# Patient Record
Sex: Female | Born: 1960 | Race: White | Hispanic: No | Marital: Married | Smoking: Never smoker
Health system: Southern US, Community
[De-identification: ages and names within clinical notes are randomized; demographics above are authoritative.]

## PROBLEM LIST (undated history)

## (undated) ENCOUNTER — Emergency Department (HOSPITAL_COMMUNITY): Payer: 59

## (undated) DIAGNOSIS — K8020 Calculus of gallbladder without cholecystitis without obstruction: Secondary | ICD-10-CM

## (undated) DIAGNOSIS — F419 Anxiety disorder, unspecified: Secondary | ICD-10-CM

## (undated) HISTORY — PX: TONSILLECTOMY: SUR1361

## (undated) HISTORY — PX: WISDOM TOOTH EXTRACTION: SHX21

---

## 1994-08-15 HISTORY — PX: KNEE ARTHROSCOPY WITH ANTERIOR CRUCIATE LIGAMENT (ACL) REPAIR: SHX5644

## 1999-10-18 ENCOUNTER — Other Ambulatory Visit: Admission: RE | Admit: 1999-10-18 | Discharge: 1999-10-18 | Payer: Self-pay | Admitting: Obstetrics and Gynecology

## 2001-01-01 ENCOUNTER — Other Ambulatory Visit: Admission: RE | Admit: 2001-01-01 | Discharge: 2001-01-01 | Payer: Self-pay | Admitting: Obstetrics and Gynecology

## 2003-07-02 ENCOUNTER — Other Ambulatory Visit: Admission: RE | Admit: 2003-07-02 | Discharge: 2003-07-02 | Payer: Self-pay | Admitting: Obstetrics and Gynecology

## 2005-04-12 ENCOUNTER — Other Ambulatory Visit: Admission: RE | Admit: 2005-04-12 | Discharge: 2005-04-12 | Payer: Self-pay | Admitting: Obstetrics and Gynecology

## 2012-06-05 ENCOUNTER — Ambulatory Visit (HOSPITAL_COMMUNITY)
Admission: RE | Admit: 2012-06-05 | Discharge: 2012-06-05 | Disposition: A | Discharge status: Home / Self Care | Payer: 59 | Source: Ambulatory Visit | Attending: Obstetrics and Gynecology | Admitting: Obstetrics and Gynecology

## 2012-06-05 ENCOUNTER — Other Ambulatory Visit (HOSPITAL_COMMUNITY): Payer: Self-pay | Admitting: Obstetrics and Gynecology

## 2012-06-05 DIAGNOSIS — T1490XA Injury, unspecified, initial encounter: Secondary | ICD-10-CM

## 2012-06-05 DIAGNOSIS — S0990XA Unspecified injury of head, initial encounter: Secondary | ICD-10-CM | POA: Insufficient documentation

## 2012-06-05 DIAGNOSIS — X58XXXA Exposure to other specified factors, initial encounter: Secondary | ICD-10-CM | POA: Insufficient documentation

## 2015-09-14 ENCOUNTER — Other Ambulatory Visit: Payer: Self-pay | Admitting: Obstetrics and Gynecology

## 2015-09-14 DIAGNOSIS — R1011 Right upper quadrant pain: Secondary | ICD-10-CM

## 2015-09-17 ENCOUNTER — Inpatient Hospital Stay: Admission: RE | Admit: 2015-09-17 | Payer: Self-pay | Source: Ambulatory Visit

## 2015-09-29 ENCOUNTER — Ambulatory Visit
Admission: RE | Admit: 2015-09-29 | Discharge: 2015-09-29 | Disposition: A | Discharge status: Home / Self Care | Payer: 59 | Source: Ambulatory Visit | Attending: Obstetrics and Gynecology | Admitting: Obstetrics and Gynecology

## 2015-09-29 DIAGNOSIS — R1011 Right upper quadrant pain: Secondary | ICD-10-CM

## 2015-10-21 ENCOUNTER — Other Ambulatory Visit: Payer: Self-pay | Admitting: General Surgery

## 2015-10-23 ENCOUNTER — Encounter (HOSPITAL_COMMUNITY): Payer: Self-pay

## 2015-10-23 ENCOUNTER — Encounter (HOSPITAL_COMMUNITY)
Admission: RE | Admit: 2015-10-23 | Discharge: 2015-10-23 | Disposition: A | Discharge status: Home / Self Care | Payer: 59 | Source: Ambulatory Visit | Attending: General Surgery | Admitting: General Surgery

## 2015-10-23 DIAGNOSIS — Z01812 Encounter for preprocedural laboratory examination: Secondary | ICD-10-CM | POA: Insufficient documentation

## 2015-10-23 DIAGNOSIS — K8020 Calculus of gallbladder without cholecystitis without obstruction: Secondary | ICD-10-CM | POA: Insufficient documentation

## 2015-10-23 HISTORY — DX: Anxiety disorder, unspecified: F41.9

## 2015-10-23 LAB — COMPREHENSIVE METABOLIC PANEL
ALBUMIN: 4.6 g/dL (ref 3.5–5.0)
ALK PHOS: 63 U/L (ref 38–126)
ALT: 21 U/L (ref 14–54)
ANION GAP: 11 (ref 5–15)
AST: 20 U/L (ref 15–41)
BUN: 16 mg/dL (ref 6–20)
CHLORIDE: 105 mmol/L (ref 101–111)
CO2: 24 mmol/L (ref 22–32)
CREATININE: 0.74 mg/dL (ref 0.44–1.00)
Calcium: 9.8 mg/dL (ref 8.9–10.3)
GFR calc non Af Amer: >60 mL/min (ref >60)
GLUCOSE: 88 mg/dL (ref 65–99)
POTASSIUM: 3.9 mmol/L (ref 3.5–5.1)
SODIUM: 140 mmol/L (ref 135–145)
Total Bilirubin: 0.8 mg/dL (ref 0.3–1.2)
Total Protein: 7.5 g/dL (ref 6.5–8.1)

## 2015-10-23 LAB — CBC WITH DIFFERENTIAL/PLATELET
BASOS PCT: 1 %
Basophils Absolute: 0 10*3/uL (ref 0.0–0.1)
EOS ABS: 0.1 10*3/uL (ref 0.0–0.7)
EOS PCT: 2 %
HCT: 41.9 % (ref 36.0–46.0)
HEMOGLOBIN: 14.4 g/dL (ref 12.0–15.0)
LYMPHS ABS: 1.3 10*3/uL (ref 0.7–4.0)
Lymphocytes Relative: 31 %
MCH: 31.9 pg (ref 26.0–34.0)
MCHC: 34.4 g/dL (ref 30.0–36.0)
MCV: 92.9 fL (ref 78.0–100.0)
MONOS PCT: 7 %
Monocytes Absolute: 0.3 10*3/uL (ref 0.1–1.0)
NEUTROS PCT: 59 %
Neutro Abs: 2.5 10*3/uL (ref 1.7–7.7)
PLATELETS: 174 10*3/uL (ref 150–400)
RBC: 4.51 MIL/uL (ref 3.87–5.11)
RDW: 12 % (ref 11.5–15.5)
WBC: 4.2 10*3/uL (ref 4.0–10.5)

## 2015-10-23 LAB — HCG, SERUM, QUALITATIVE: Preg, Serum: NEGATIVE

## 2015-10-23 NOTE — Progress Notes (Signed)
Pt. Is without PCP, followed by Dr. Arelia SneddonMcComb.  Pt. Reports that she had stress test long time ago, /w Dr. Deborah Chalkennant, all was found to be wnl, No need for any cardiac f/u. Pt. Denies all chest concerns today.

## 2015-10-23 NOTE — Pre-Procedure Instructions (Signed)
April Randolph  10/23/2015      [id number]- [place], [place] - [address] [facility] [id number] [place] [id number] Phone: [phone] Fax: [phone]    Your procedure is scheduled on 10/28/2015.  [facility]  Call this number if you have problems the morning of surgery:  [phone]   Remember:  Do not eat food or drink liquids after midnight.   Take these medicines the morning of surgery with A SIP OF WATER : Xanax, Tylenol is OK before surgery                       NO more ibuprofen- STOP   Do not wear jewelry, make-up or nail polish.   Do not wear lotions, powders, or perfumes.  You may wear deodorant.   Do not shave 48 hours prior to surgery.    Do not bring valuables to the hospital.   [facility] is not responsible for any belongings or valuables.  Contacts, dentures or bridgework may not be worn into surgery.  Leave your suitcase in the car.  After surgery it may be brought to your room.  For patients admitted to the hospital, discharge time will be determined by your treatment team.  Patients discharged the day of surgery will not be allowed to drive home.   Name and phone number of your driver:   Daughter   Special instructions:  Special Instructions: [facility] - Preparing for Surgery  Before surgery, you can play an important role.  Because skin is not sterile, your skin needs to be as free of germs as possible.  You can reduce the number of germs on you skin by washing with CHG (chlorahexidine gluconate) soap before surgery.  CHG is an antiseptic cleaner which kills germs and bonds with the skin to continue killing germs even after washing.  Please DO NOT use if you have an allergy to CHG or antibacterial soaps.  If your skin becomes reddened/irritated stop using the CHG and inform your nurse when you arrive at Short Stay.  Do not shave (including  legs and underarms) for at least 48 hours prior to the first CHG shower.  You may shave your face.  Please follow these instructions carefully:   1.  Shower with CHG Soap the night before surgery and the  morning of Surgery.  2.  If you choose to wash your hair, wash your hair first as usual with your  normal shampoo.  3.  After you shampoo, rinse your hair and body thoroughly to remove the  Shampoo.  4.  Use CHG as you would any other liquid soap.  You can apply chg directly to the skin and wash gently with scrungie or a clean washcloth.  5.  Apply the CHG Soap to your body ONLY FROM THE NECK DOWN.    Do not use on open wounds or open sores.  Avoid contact with your eyes, ears, mouth and genitals (private parts).  Wash genitals (private parts)   with your normal soap.  6.  Wash thoroughly, paying special attention to the area where your surgery will be performed.  7.  Thoroughly rinse your body with warm water from the neck down.  8.  DO NOT shower/wash with your normal soap after using and rinsing off   the CHG Soap.  9.  Pat yourself dry with  a clean towel.            10.  Wear clean pajamas.            11.  Place clean sheets on your bed the night of your first shower and do not sleep with pets.  Day of Surgery  Do not apply any lotions/deodorants the morning of surgery.  Please wear clean clothes to the hospital/surgery center.  Please read over the following fact sheets that you were given. Pain Booklet, Coughing and Deep Breathing and Surgical Site Infection Prevention

## 2015-10-27 ENCOUNTER — Encounter (HOSPITAL_BASED_OUTPATIENT_CLINIC_OR_DEPARTMENT_OTHER): Payer: Self-pay | Admitting: *Deleted

## 2015-10-27 MED ORDER — CIPROFLOXACIN IN D5W 400 MG/200ML IV SOLN
400.0000 mg | INTRAVENOUS | Status: AC
  Administered 2015-10-28: 400 mg via INTRAVENOUS

## 2015-10-28 ENCOUNTER — Encounter (HOSPITAL_BASED_OUTPATIENT_CLINIC_OR_DEPARTMENT_OTHER)
Admission: RE | Disposition: A | Discharge status: Home / Self Care | Payer: Self-pay | Source: Ambulatory Visit | Attending: General Surgery

## 2015-10-28 ENCOUNTER — Ambulatory Visit (HOSPITAL_COMMUNITY)
Admission: RE | Admit: 2015-10-28 | Discharge: 2015-10-28 | Disposition: A | Discharge status: Home / Self Care | Payer: 59 | Source: Ambulatory Visit | Attending: General Surgery | Admitting: General Surgery

## 2015-10-28 ENCOUNTER — Ambulatory Visit (HOSPITAL_BASED_OUTPATIENT_CLINIC_OR_DEPARTMENT_OTHER): Payer: 59 | Admitting: Certified Registered"

## 2015-10-28 ENCOUNTER — Encounter (HOSPITAL_BASED_OUTPATIENT_CLINIC_OR_DEPARTMENT_OTHER): Payer: Self-pay | Admitting: Certified Registered"

## 2015-10-28 DIAGNOSIS — F419 Anxiety disorder, unspecified: Secondary | ICD-10-CM | POA: Insufficient documentation

## 2015-10-28 DIAGNOSIS — K8010 Calculus of gallbladder with chronic cholecystitis without obstruction: Secondary | ICD-10-CM | POA: Diagnosis not present

## 2015-10-28 DIAGNOSIS — K8020 Calculus of gallbladder without cholecystitis without obstruction: Secondary | ICD-10-CM | POA: Diagnosis present

## 2015-10-28 HISTORY — PX: CHOLECYSTECTOMY: SHX55

## 2015-10-28 HISTORY — DX: Calculus of gallbladder without cholecystitis without obstruction: K80.20

## 2015-10-28 SURGERY — LAPAROSCOPIC CHOLECYSTECTOMY
Anesthesia: General | Site: Abdomen | Laterality: Bilateral

## 2015-10-28 MED ORDER — ONDANSETRON HCL 4 MG/2ML IJ SOLN
INTRAMUSCULAR | Status: DC | PRN
  Administered 2015-10-28: 4 mg via INTRAVENOUS

## 2015-10-28 MED ORDER — BUPIVACAINE-EPINEPHRINE 0.25% -1:200000 IJ SOLN
INTRAMUSCULAR | Status: DC | PRN
  Administered 2015-10-28: 11 mL

## 2015-10-28 MED ORDER — HYDROMORPHONE HCL 1 MG/ML IJ SOLN
0.2500 mg | INTRAMUSCULAR | Status: DC | PRN
  Administered 2015-10-28 (×5): 0.5 mg via INTRAVENOUS

## 2015-10-28 MED ORDER — HYDROMORPHONE HCL 1 MG/ML IJ SOLN
INTRAMUSCULAR | Status: AC
  Filled 2015-10-28: qty 1

## 2015-10-28 MED ORDER — LACTATED RINGERS IV SOLN
INTRAVENOUS | Status: DC
  Administered 2015-10-28 (×2): via INTRAVENOUS

## 2015-10-28 MED ORDER — FENTANYL CITRATE (PF) 100 MCG/2ML IJ SOLN
50.0000 ug | INTRAMUSCULAR | Status: DC | PRN
  Administered 2015-10-28 (×2): 50 ug via INTRAVENOUS

## 2015-10-28 MED ORDER — CIPROFLOXACIN IN D5W 400 MG/200ML IV SOLN
INTRAVENOUS | Status: AC
  Filled 2015-10-28: qty 200

## 2015-10-28 MED ORDER — MIDAZOLAM HCL 2 MG/2ML IJ SOLN
1.0000 mg | INTRAMUSCULAR | Status: DC | PRN
  Administered 2015-10-28: 2 mg via INTRAVENOUS

## 2015-10-28 MED ORDER — LIDOCAINE HCL (CARDIAC) 20 MG/ML IV SOLN
INTRAVENOUS | Status: AC
  Filled 2015-10-28: qty 5

## 2015-10-28 MED ORDER — ROCURONIUM BROMIDE 50 MG/5ML IV SOLN
INTRAVENOUS | Status: AC
  Filled 2015-10-28: qty 1

## 2015-10-28 MED ORDER — OXYCODONE HCL 5 MG PO TABS: 5.0000 mg | ORAL_TABLET | Freq: Four times a day (QID) | ORAL | Status: AC | PRN

## 2015-10-28 MED ORDER — DEXAMETHASONE SODIUM PHOSPHATE 10 MG/ML IJ SOLN
INTRAMUSCULAR | Status: AC
  Filled 2015-10-28: qty 1

## 2015-10-28 MED ORDER — PROPOFOL 10 MG/ML IV BOLUS
INTRAVENOUS | Status: DC | PRN
  Administered 2015-10-28: 200 mg via INTRAVENOUS

## 2015-10-28 MED ORDER — HYDROMORPHONE HCL 1 MG/ML IJ SOLN: 1.0000 mg | INTRAMUSCULAR | Status: DC | PRN

## 2015-10-28 MED ORDER — DEXAMETHASONE SODIUM PHOSPHATE 4 MG/ML IJ SOLN
INTRAMUSCULAR | Status: DC | PRN
  Administered 2015-10-28: 10 mg via INTRAVENOUS

## 2015-10-28 MED ORDER — PROMETHAZINE HCL 25 MG/ML IJ SOLN: 6.2500 mg | INTRAMUSCULAR | Status: DC | PRN

## 2015-10-28 MED ORDER — PROPOFOL 500 MG/50ML IV EMUL
INTRAVENOUS | Status: AC
  Filled 2015-10-28: qty 50

## 2015-10-28 MED ORDER — KETOROLAC TROMETHAMINE 30 MG/ML IJ SOLN
30.0000 mg | Freq: Four times a day (QID) | INTRAMUSCULAR | Status: DC | PRN
  Administered 2015-10-28: 30 mg via INTRAVENOUS

## 2015-10-28 MED ORDER — KETOROLAC TROMETHAMINE 30 MG/ML IJ SOLN
INTRAMUSCULAR | Status: AC
  Filled 2015-10-28: qty 1

## 2015-10-28 MED ORDER — HYDROCODONE-ACETAMINOPHEN 10-325 MG PO TABS: 1.0000 | ORAL_TABLET | Freq: Four times a day (QID) | ORAL | Status: AC | PRN

## 2015-10-28 MED ORDER — ROCURONIUM BROMIDE 100 MG/10ML IV SOLN
INTRAVENOUS | Status: DC | PRN
  Administered 2015-10-28: 40 mg via INTRAVENOUS

## 2015-10-28 MED ORDER — SUGAMMADEX SODIUM 200 MG/2ML IV SOLN
INTRAVENOUS | Status: DC | PRN
  Administered 2015-10-28: 169.4 mg via INTRAVENOUS

## 2015-10-28 MED ORDER — LIDOCAINE HCL (CARDIAC) 20 MG/ML IV SOLN
INTRAVENOUS | Status: DC | PRN
  Administered 2015-10-28: 60 mg via INTRAVENOUS

## 2015-10-28 MED ORDER — GLYCOPYRROLATE 0.2 MG/ML IJ SOLN: 0.2000 mg | Freq: Once | INTRAMUSCULAR | Status: DC | PRN

## 2015-10-28 MED ORDER — FENTANYL CITRATE (PF) 100 MCG/2ML IJ SOLN
INTRAMUSCULAR | Status: AC
  Filled 2015-10-28: qty 2

## 2015-10-28 MED ORDER — OXYCODONE HCL 5 MG PO TABS: 5.0000 mg | ORAL_TABLET | ORAL | Status: DC | PRN

## 2015-10-28 MED ORDER — MIDAZOLAM HCL 2 MG/2ML IJ SOLN
INTRAMUSCULAR | Status: AC
  Filled 2015-10-28: qty 2

## 2015-10-28 MED ORDER — ONDANSETRON HCL 4 MG/2ML IJ SOLN
INTRAMUSCULAR | Status: AC
  Filled 2015-10-28: qty 2

## 2015-10-28 MED ORDER — SCOPOLAMINE 1 MG/3DAYS TD PT72: 1.0000 | MEDICATED_PATCH | Freq: Once | TRANSDERMAL | Status: DC | PRN

## 2015-10-28 MED ORDER — OXYCODONE HCL 5 MG PO TABS
5.0000 mg | ORAL_TABLET | ORAL | Status: DC | PRN
  Administered 2015-10-28: 5 mg via ORAL

## 2015-10-28 MED ORDER — PROMETHAZINE HCL 12.5 MG PO TABS: 12.5000 mg | ORAL_TABLET | Freq: Four times a day (QID) | ORAL | Status: AC | PRN

## 2015-10-28 MED ORDER — SODIUM CHLORIDE 0.9 % IR SOLN
Status: DC | PRN
Start: 2015-10-28 — End: 2015-10-28
  Administered 2015-10-28: 200 mL

## 2015-10-28 MED ORDER — PHENYLEPHRINE HCL 10 MG/ML IJ SOLN
INTRAMUSCULAR | Status: DC | PRN
  Administered 2015-10-28: 80 ug via INTRAVENOUS

## 2015-10-28 MED ORDER — OXYCODONE HCL 5 MG PO TABS
ORAL_TABLET | ORAL | Status: AC
  Filled 2015-10-28: qty 1

## 2015-10-28 SURGICAL SUPPLY — 47 items
APPLIER CLIP 5 13 M/L LIGAMAX5 (MISCELLANEOUS) ×2
APR CLP MED LRG 5 ANG JAW (MISCELLANEOUS) ×1
BAG SPEC RTRVL 10 TROC 200 (ENDOMECHANICALS) ×1
BLADE CLIPPER SURG (BLADE) IMPLANT
CANISTER SUCT 1200ML W/VALVE (MISCELLANEOUS) ×2 IMPLANT
CHLORAPREP W/TINT 26ML (MISCELLANEOUS) ×2 IMPLANT
CLIP APPLIE 5 13 M/L LIGAMAX5 (MISCELLANEOUS) ×1 IMPLANT
COVER MAYO STAND STRL (DRAPES) IMPLANT
DECANTER SPIKE VIAL GLASS SM (MISCELLANEOUS) IMPLANT
DEVICE TROCAR PUNCTURE CLOSURE (ENDOMECHANICALS) IMPLANT
DRAPE C-ARM 42X72 X-RAY (DRAPES) IMPLANT
DRAPE LAPAROSCOPIC ABDOMINAL (DRAPES) ×2 IMPLANT
ELECT REM PT RETURN 9FT ADLT (ELECTROSURGICAL) ×2
ELECTRODE REM PT RTRN 9FT ADLT (ELECTROSURGICAL) ×1 IMPLANT
FILTER SMOKE EVAC LAPAROSHD (FILTER) IMPLANT
GLOVE BIO SURGEON STRL SZ 6.5 (GLOVE) ×2 IMPLANT
GLOVE BIO SURGEON STRL SZ7 (GLOVE) ×2 IMPLANT
GLOVE BIOGEL M 7.0 STRL (GLOVE) ×2 IMPLANT
GLOVE BIOGEL PI IND STRL 6.5 (GLOVE) ×1 IMPLANT
GLOVE BIOGEL PI IND STRL 7.5 (GLOVE) ×2 IMPLANT
GLOVE BIOGEL PI INDICATOR 6.5 (GLOVE) ×1
GLOVE BIOGEL PI INDICATOR 7.5 (GLOVE) ×2
GLOVE ECLIPSE 6.5 STRL STRAW (GLOVE) ×2 IMPLANT
GLOVE SURG SS PI 7.0 STRL IVOR (GLOVE) ×2 IMPLANT
GOWN STRL REUS W/ TWL LRG LVL3 (GOWN DISPOSABLE) ×4 IMPLANT
GOWN STRL REUS W/TWL LRG LVL3 (GOWN DISPOSABLE) ×8
HEMOSTAT SNOW SURGICEL 2X4 (HEMOSTASIS) IMPLANT
LIQUID BAND (GAUZE/BANDAGES/DRESSINGS) ×2 IMPLANT
NS IRRIG 1000ML POUR BTL (IV SOLUTION) ×2 IMPLANT
PACK BASIN DAY SURGERY FS (CUSTOM PROCEDURE TRAY) ×2 IMPLANT
POUCH RETRIEVAL ECOSAC 10 (ENDOMECHANICALS) ×1 IMPLANT
POUCH RETRIEVAL ECOSAC 10MM (ENDOMECHANICALS) ×1
SCISSORS LAP 5X35 DISP (ENDOMECHANICALS) ×2 IMPLANT
SET CHOLANGIOGRAPH 5 50 .035 (SET/KITS/TRAYS/PACK) IMPLANT
SET IRRIG TUBING LAPAROSCOPIC (IRRIGATION / IRRIGATOR) ×2 IMPLANT
SLEEVE ENDOPATH XCEL 5M (ENDOMECHANICALS) ×4 IMPLANT
SLEEVE SCD COMPRESS KNEE MED (MISCELLANEOUS) ×2 IMPLANT
SPECIMEN JAR SMALL (MISCELLANEOUS) ×2 IMPLANT
STRIP CLOSURE SKIN 1/2X4 (GAUZE/BANDAGES/DRESSINGS) ×2 IMPLANT
SUT MNCRL AB 4-0 PS2 18 (SUTURE) ×2 IMPLANT
SUT VICRYL 0 UR6 27IN ABS (SUTURE) ×6 IMPLANT
TOWEL OR 17X24 6PK STRL BLUE (TOWEL DISPOSABLE) ×4 IMPLANT
TRAY LAPAROSCOPIC (CUSTOM PROCEDURE TRAY) ×2 IMPLANT
TROCAR XCEL BLUNT TIP 100MML (ENDOMECHANICALS) ×2 IMPLANT
TROCAR XCEL NON-BLD 5MMX100MML (ENDOMECHANICALS) ×2 IMPLANT
TUBE CONNECTING 20X1/4 (TUBING) ×2 IMPLANT
TUBING INSUFFLATION (TUBING) ×2 IMPLANT

## 2015-10-28 NOTE — Anesthesia Preprocedure Evaluation (Addendum)
Anesthesia Evaluation  Patient identified by MRN, date of birth, ID band Patient awake    Reviewed: Allergy & Precautions, Patient's Chart, lab work & pertinent test results  History of Anesthesia Complications (+) PONV and history of anesthetic complications  Airway Mallampati: I       Dental  (+) Teeth Intact   Pulmonary neg pulmonary ROS,    breath sounds clear to auscultation       Cardiovascular negative cardio ROS   Rhythm:Regular Rate:Normal     Neuro/Psych PSYCHIATRIC DISORDERS Anxiety negative neurological ROS     GI/Hepatic negative GI ROS, Neg liver ROS,   Endo/Other  negative endocrine ROS  Renal/GU negative Renal ROS     Musculoskeletal negative musculoskeletal ROS (+)   Abdominal   Peds  Hematology negative hematology ROS (+)   Anesthesia Other Findings   Reproductive/Obstetrics                            Anesthesia Physical Anesthesia Plan  ASA: I  Anesthesia Plan: General   Post-op Pain Management:    Induction: Intravenous  Airway Management Planned: Oral ETT  Additional Equipment:   Intra-op Plan:   Post-operative Plan: Extubation in OR  Informed Consent: I have reviewed the patients History and Physical, chart, labs and discussed the procedure including the risks, benefits and alternatives for the proposed anesthesia with the patient or authorized representative who has indicated his/her understanding and acceptance.   Dental advisory given  Plan Discussed with: CRNA and Surgeon  Anesthesia Plan Comments:         Anesthesia Quick Evaluation

## 2015-10-28 NOTE — Anesthesia Procedure Notes (Addendum)
Procedure Name: Intubation Date/Time: 10/28/2015 11:02 AM Performed by: Sheffield Hawker D Pre-anesthesia Checklist: Patient identified, Emergency Drugs available, Suction available and Patient being monitored Patient Re-evaluated:Patient Re-evaluated prior to inductionOxygen Delivery Method: Circle System Utilized Preoxygenation: Pre-oxygenation with 100% oxygen Intubation Type: IV induction Ventilation: Mask ventilation without difficulty Laryngoscope Size: Mac and 3 Tube type: Oral Tube size: 7.0 mm Number of attempts: 1 Airway Equipment and Method: Stylet and Oral airway Placement Confirmation: ETT inserted through vocal cords under direct vision,  positive ETCO2 and breath sounds checked- equal and bilateral Secured at: 21 cm Tube secured with: Tape Dental Injury: Teeth and Oropharynx as per pre-operative assessment    Performed by: Majd Tissue D

## 2015-10-28 NOTE — Discharge Instructions (Signed)
[facility] °LAPAROSCOPIC SURGERY: POST OP INSTRUCTIONS ° °Always review your discharge instruction sheet given to you by the facility where your surgery was performed. °IF YOU HAVE DISABILITY OR FAMILY LEAVE FORMS, YOU MUST BRING THEM TO THE OFFICE FOR PROCESSING.   °DO NOT GIVE THEM TO YOUR DOCTOR. ° °1. A prescription for pain medication may be given to you upon discharge.  Take your pain medication as prescribed, if needed.  If narcotic pain medicine is not needed, then you may take acetaminophen (Tylenol), naprosyn (Alleve), or ibuprofen (Advil) as needed. °2. Take your usually prescribed medications unless otherwise directed. °3. If you need a refill on your pain medication, please contact your pharmacy.  They will contact our office to request authorization. Prescriptions will not be filled after 5pm or on week-ends. °4. You should follow a light diet the first few days after arrival home, such as soup and crackers, etc.  Be sure to include lots of fluids daily. °5. Most patients will experience some swelling and bruising in the area of the incisions.  Ice packs will help.  Swelling and bruising can take several days to resolve.  °6. It is common to experience some constipation if taking pain medication after surgery.  Increasing fluid intake and taking a stool softener (such as Colace) will usually help or prevent this problem from occurring.  A mild laxative (Milk of Magnesia or Miralax) should be taken according to package instructions if there are no bowel movements after 48 hours. °7. Unless discharge instructions indicate otherwise, you may remove your bandages 48 hours after surgery, and you may shower at that time.  You may have steri-strips (small skin tapes) in place directly over the incision.  These strips should be left on the skin for 7-10 days.  If your surgeon used skin glue on the incision, you may shower in 24 hours.  The glue will flake  off over the next 2-3 weeks.  Any sutures or staples will be removed at the office during your follow-up visit. °8. ACTIVITIES:  You may resume regular (light) daily activities beginning the next day--such as daily self-care, walking, climbing stairs--gradually increasing activities as tolerated.  You may have sexual intercourse when it is comfortable.  Refrain from any heavy lifting or straining until approved by your doctor. °a. You may drive when you are no longer taking prescription pain medication, you can comfortably wear a seatbelt, and you can safely maneuver your car and apply brakes. °b. RETURN TO WORK:  __________________________________________________________ °9. You should see your doctor in the office for a follow-up appointment approximately 2-3 weeks after your surgery.  Make sure that you call for this appointment within a day or two after you arrive home to insure a convenient appointment time. °10. OTHER INSTRUCTIONS: __________________________________________________________________________________________________________________________ __________________________________________________________________________________________________________________________ °WHEN TO CALL YOUR DOCTOR: °1. Fever over 101.0 °2. Inability to urinate °3. Continued bleeding from incision. °4. Increased pain, redness, or drainage from the incision. °5. Increasing abdominal pain ° °The clinic staff is available to answer your questions during regular business hours.  Please don’t hesitate to call and ask to speak to one of the nurses for clinical concerns.  If you have a medical emergency, go to the nearest emergency room or call 911.  A surgeon from [facility] is always on call at the hospital. °[address], [address], [place], [place]  [address] ? [address] [address], [place], [place]   [address] °[phone] ? [phone] ? [phone] °Web site: [email or web address] ° ° ° ° °Post  Anesthesia Home Care Instructions ° °Activity: °Get plenty of rest for the remainder of the day. A responsible adult should stay with you for 24 hours following the procedure.  °For the next 24 hours, DO NOT: °-Drive a car °-Operate machinery °-Drink alcoholic beverages °-Take any medication unless instructed by your physician °-Make any legal decisions or sign important papers. ° °Meals: °Start with liquid foods such as gelatin or soup. Progress to regular foods as tolerated. Avoid greasy, spicy, heavy foods. If nausea and/or vomiting occur, drink only clear liquids until the nausea and/or vomiting subsides. Call your physician if vomiting continues. ° °Special Instructions/Symptoms: °Your throat may feel dry or sore from the anesthesia or the breathing tube placed in your throat during surgery. If this causes discomfort, gargle with warm salt water. The discomfort should disappear within 24 hours. ° °If you had a scopolamine patch placed behind your ear for the management of post- operative nausea and/or vomiting: ° °1. The medication in the patch is effective for 72 hours, after which it should be removed.  Wrap patch in a tissue and discard in the trash. Wash hands thoroughly with soap and water. °2. You may remove the patch earlier than 72 hours if you experience unpleasant side effects which may include dry mouth, dizziness or visual disturbances. °3. Avoid touching the patch. Wash your hands with soap and water after contact with the patch. °  ° °

## 2015-10-28 NOTE — H&P (Signed)
April Randolph is an 55 y.o. female.   Chief Complaint: gallstones HPI: [age] yo healthy female presents with episdoe recently of ruq pain that lasted about nine hours associated with nausea. resolved and has had some residual discomfort occasionally. she underwent us that shows gb full of stones. she is doing well today and is here to consider options.    Past Medical History  Diagnosis Date  . Anxiety   . Gallstones     Past Surgical History  Procedure Laterality Date  . Knee arthroscopy with anterior cruciate ligament (acl) repair Right 1996  . Vaginal delivery      x3  . Tonsillectomy    . Wisdom tooth extraction      History reviewed. No pertinent family history. Social History:  reports that she has never smoked. She does not have any smokeless tobacco history on file. She reports that she drinks alcohol. She reports that she does not use illicit drugs.  Allergies:  Allergies  Allergen Reactions  . Penicillins Other (See Comments)    Childhood reaction    Medications Prior to Admission  Medication Sig Dispense Refill  . ALPRAZolam (XANAX) 0.5 MG tablet Take 0.5 mg by mouth 2 (two) times daily as needed for anxiety.    . cholecalciferol (VITAMIN D) 1000 units tablet Take 1,000 Units by mouth daily.    Marland Kitchen. ibuprofen (ADVIL,MOTRIN) 200 MG tablet Take 400 mg by mouth every 6 (six) hours as needed.    . loperamide (IMODIUM A-D) 2 MG tablet Take 2 mg by mouth 4 (four) times daily as needed for diarrhea or loose stools.    . Multiple Vitamins-Minerals (MULTIVITAMIN WITH MINERALS) tablet Take 1 tablet by mouth daily.    . vitamin C (ASCORBIC ACID) 500 MG tablet Take 500 mg by mouth daily.      No results found for this or any previous visit (from the past 48 hour(s)). No results found.  ROS   Review of Systems Fay Records(Ashley Beck CMA; 10/08/2015 1:45 PM) General Not Present- Appetite Loss, Chills, Fatigue, Fever, Night Sweats, Weight Gain and Weight Loss. Skin Not Present- Change  in Wart/Mole, Dryness, Hives, Jaundice, New Lesions, Non-Healing Wounds, Rash and Ulcer. HEENT Present- Wears glasses/contact lenses. Not Present- Earache, Hearing Loss, Hoarseness, Nose Bleed, Oral Ulcers, Ringing in the Ears, Seasonal Allergies, Sinus Pain, Sore Throat, Visual Disturbances and Yellow Eyes. Neurological Not Present- Decreased Memory, Fainting, Headaches, Numbness, Seizures, Tingling, Tremor, Trouble walking and Weakness.  Height 5' 7.75" (1.721 m), weight 84.823 kg (187 lb), last menstrual period 06/05/2012. Physical Exam   Vitals Fay Records(Ashley Beck CMA; 10/08/2015 1:46 PM) 10/08/2015 1:46 PM Weight: 190 lb Height: 67in Body Surface Area: 1.98 m Body Mass Index: 29.76 kg/m  Temp.: 97.34F  Pulse: 95 (Regular)  BP: 124/72 (Sitting, Left Arm, Standard) Physical Exam Emelia Loron(Brannen Koppen MD; 10/08/2015 2:24 PM) General Mental Status-Alert. Orientation-Oriented X3. Eye Sclera/Conjunctiva - Bilateral-No scleral icterus. Chest and Lung Exam Chest and lung exam reveals -on auscultation, normal breath sounds, no adventitious sounds and normal vocal resonance. Cardiovascular Cardiovascular examination reveals -normal heart sounds, regular rate and rhythm with no murmurs. Abdomen Note: mild ruq tenderness to deep palpation no murphys soft   Assessment/Plan  GALLSTONES (K80.20) Story: I recommended lap chole. we went over options and risks of no surgery. she would like to consider options and will call back. I discussed the procedure in detail. The patient was given Agricultural engineereducational material. We discussed the risks and benefits of a laparoscopic cholecystectomy and possible cholangiogram  including, but not limited to bleeding, infection, injury to surrounding structures such as the intestine or liver, bile leak, retained gallstones, need to convert to an open procedure, prolonged diarrhea common bile duct injury. The likelihood of improvement in symptoms and return to  the patient's normal status is good. We discussed the typical post-operative recovery course.  Emelia Loron, MD 10/28/2015, 9:35 AM

## 2015-10-28 NOTE — Anesthesia Postprocedure Evaluation (Signed)
Anesthesia Post Note  Patient: April Randolph  Procedure(s) Performed: Procedure(s) (LRB): LAPAROSCOPIC CHOLECYSTECTOMY (Bilateral)  Patient location during evaluation: PACU Anesthesia Type: General Level of consciousness: awake and alert Pain management: satisfactory to patient Vital Signs Assessment: post-procedure vital signs reviewed and stable Respiratory status: spontaneous breathing, nonlabored ventilation, respiratory function stable and patient connected to nasal cannula oxygen Cardiovascular status: blood pressure returned to baseline and stable Postop Assessment: no signs of nausea or vomiting Anesthetic complications: no    Last Vitals:  Filed Vitals:   10/28/15 1330 10/28/15 1415  BP: 128/76 133/65  Pulse: 77 89  Temp:  36.7 C  Resp: 7 14    Last Pain:  Filed Vitals:   10/28/15 1423  PainSc: 4                  Macari Zalesky,JAMES TERRILL

## 2015-10-28 NOTE — Op Note (Signed)
Preoperative diagnosis: symptomatic cholelithiasis Postoperative diagnosis: saa Procedure: laparoscopic cholecystectomy Surgeon: Dr Harden MoMatt Laytoya Ion Anesthesia: general EBL: minimal Drains none Specimen gb and contents to pathology Complications: none Sponge count correct at completion Disposition to recovery stable  Indications: This is a 55 yo female with biliary colic. We discussed proceeding with lap chole.   Procedure: After informed consent was obtained the patient was taken to the operating room. She was given antibiotics. Sequential compression devices were on her legs. She was placed under general anesthesia without complication. Her abdomen was prepped and draped in the standard sterile surgical fashion. A surgical timeout was then performed.  I infiltrated marcaine below the umbilicus. I made an incision and then entered the fascia sharply.she had prior incision at this site.  I then entered the peritoneum bluntly. There was no evidence of an entry injury. I placed a 0 vicryl pursestring suture and inserted a hasson trocar. I then inserted 3 further 5 mm trocars in the epigastrium and ruq.I was able to grasp the gallbladder and retract it cephalad and lateral.It was full of stones. I then obtained the critical view of safety. I was able to identify the duct and place three clips proximally and one distally. The duct was divided. The duct was viable and the clips traversed the duct. I then treated the cystic artery the same.  I then removed the gallbladder from the liver bed and placed it in a bag. I removed the gallbladder from the umbilical incision. I then obtained hemostasis and irrigated. I then removed the umbilical trocar and closed with 0 vicryl and the endoclose device after tying down the pursestring.  I then desufflated the abdomen and removed all my remaining trocars. I then closed these with 4-0 Monocryl and Dermabond. She tolerated this well was extubated and  transferred to the recovery room in stable condition

## 2015-10-28 NOTE — Transfer of Care (Signed)
Immediate Anesthesia Transfer of Care Note  Patient: April Randolph  Procedure(s) Performed: Procedure(s): LAPAROSCOPIC CHOLECYSTECTOMY (Bilateral)  Patient Location: PACU  Anesthesia Type:General  Level of Consciousness: awake, alert , oriented and patient cooperative  Airway & Oxygen Therapy: Patient Spontanous Breathing and Patient connected to face mask oxygen  Post-op Assessment: Report given to RN and Post -op Vital signs reviewed and stable  Post vital signs: Reviewed and stable  Last Vitals:  Filed Vitals:   10/28/15 0942  BP: 134/53  Pulse: 89  Temp: 36.7 C  Resp: 20    Complications: No apparent anesthesia complications

## 2015-10-28 NOTE — Interval H&P Note (Signed)
History and Physical Interval Note:  10/28/2015 10:33 AM  April Randolph  has presented today for surgery, with the diagnosis of gallstones  The various methods of treatment have been discussed with the patient and family. After consideration of risks, benefits and other options for treatment, the patient has consented to  Procedure(s): LAPAROSCOPIC CHOLECYSTECTOMY (N/A) as a surgical intervention .  The patient's history has been reviewed, patient examined, no change in status, stable for surgery.  I have reviewed the patient's chart and labs.  Questions were answered to the patient's satisfaction.     Jaina Morin

## 2015-10-29 ENCOUNTER — Encounter (HOSPITAL_BASED_OUTPATIENT_CLINIC_OR_DEPARTMENT_OTHER): Payer: Self-pay | Admitting: General Surgery

## 2015-11-20 ENCOUNTER — Encounter (HOSPITAL_BASED_OUTPATIENT_CLINIC_OR_DEPARTMENT_OTHER): Payer: Self-pay | Admitting: General Surgery

## 2016-09-06 DIAGNOSIS — Z0000 Encounter for general adult medical examination without abnormal findings: Secondary | ICD-10-CM | POA: Diagnosis not present

## 2016-09-09 DIAGNOSIS — Z0000 Encounter for general adult medical examination without abnormal findings: Secondary | ICD-10-CM | POA: Diagnosis not present

## 2016-09-26 DIAGNOSIS — H04123 Dry eye syndrome of bilateral lacrimal glands: Secondary | ICD-10-CM | POA: Diagnosis not present

## 2016-09-28 DIAGNOSIS — Z01419 Encounter for gynecological examination (general) (routine) without abnormal findings: Secondary | ICD-10-CM | POA: Diagnosis not present

## 2016-09-28 DIAGNOSIS — Z1231 Encounter for screening mammogram for malignant neoplasm of breast: Secondary | ICD-10-CM | POA: Diagnosis not present

## 2016-10-10 DIAGNOSIS — R87610 Atypical squamous cells of undetermined significance on cytologic smear of cervix (ASC-US): Secondary | ICD-10-CM | POA: Diagnosis not present

## 2016-10-10 DIAGNOSIS — N72 Inflammatory disease of cervix uteri: Secondary | ICD-10-CM | POA: Diagnosis not present

## 2016-11-03 DIAGNOSIS — N950 Postmenopausal bleeding: Secondary | ICD-10-CM | POA: Diagnosis not present

## 2017-03-18 IMAGING — US US ABDOMEN COMPLETE
1 series · 13 of 25 positions shown · non-contrast
Comparison: None in PACs

CLINICAL DATA: Two months of right upper quadrant abdominal pain.

EXAM:
ABDOMEN ULTRASOUND COMPLETE

[Series 1: us abdomen complete · 0.25mm/px · 13 of 86 slices shown]
[im 1/86]
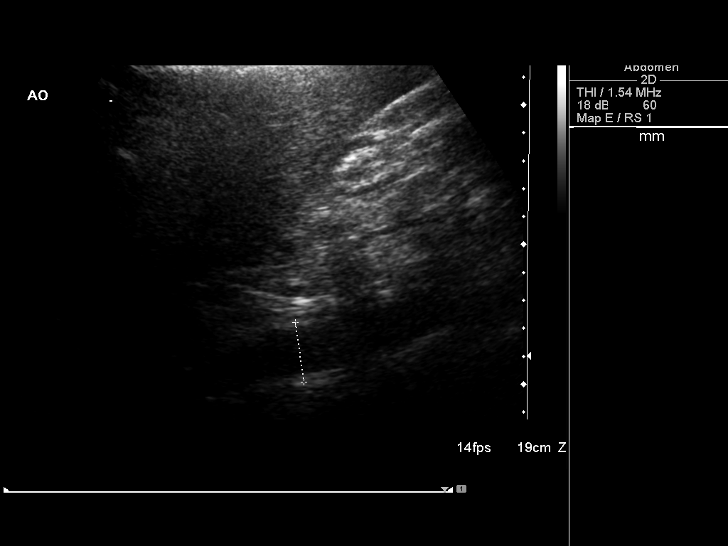
[im 8/86]
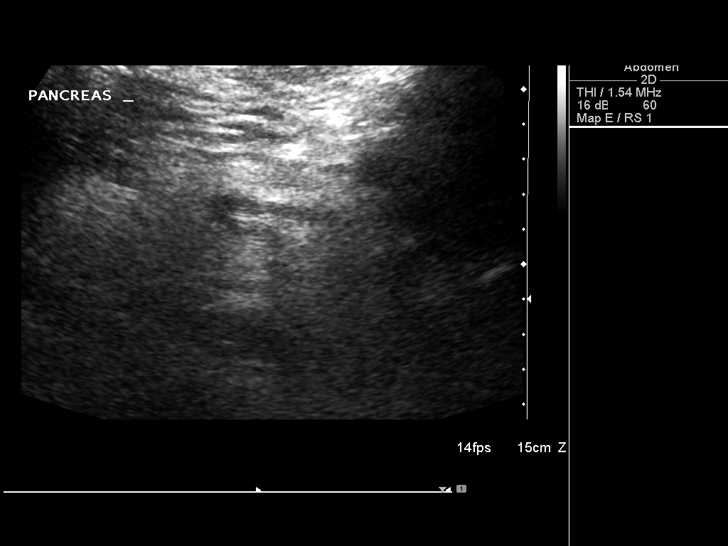
[im 15/86]
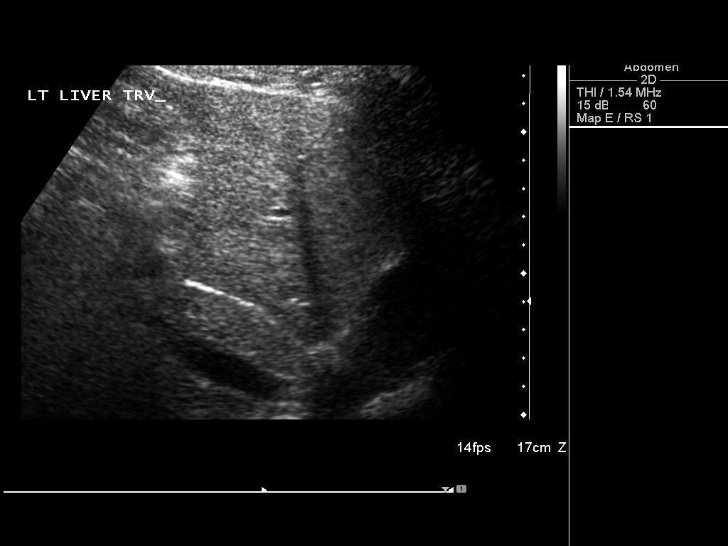
[im 22/86]
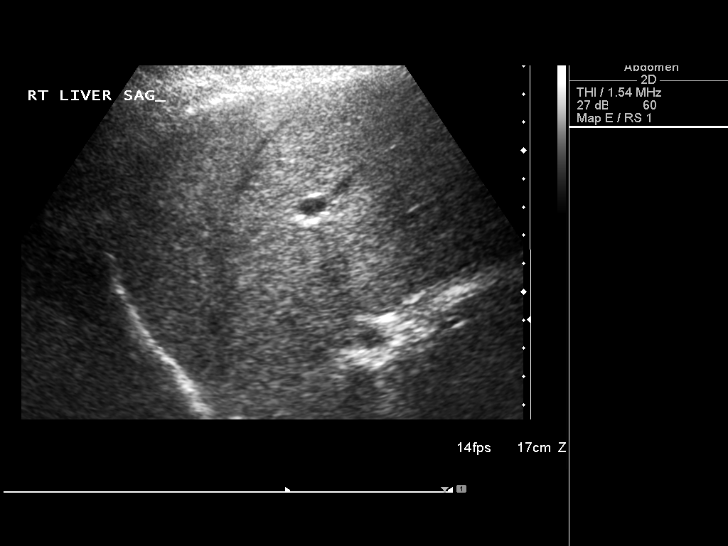
[im 29/86]
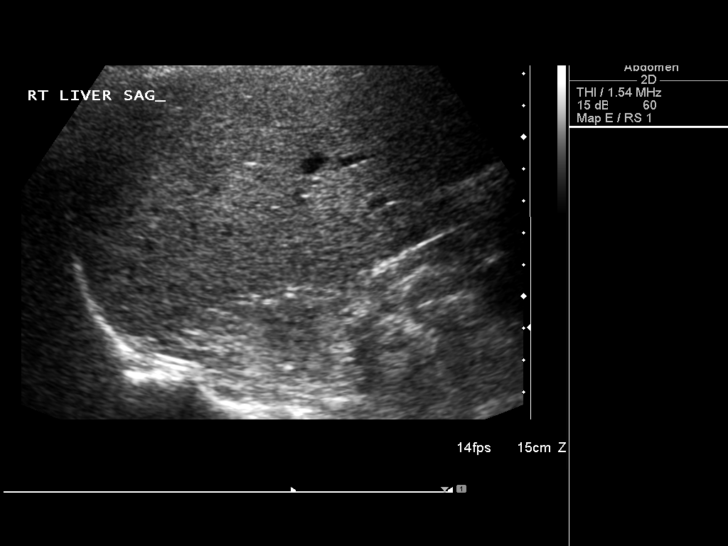
[im 36/86]
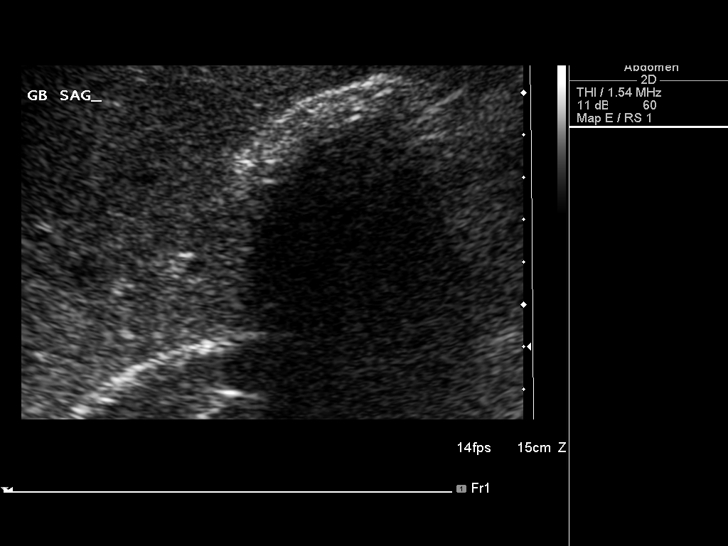
[im 43/86]
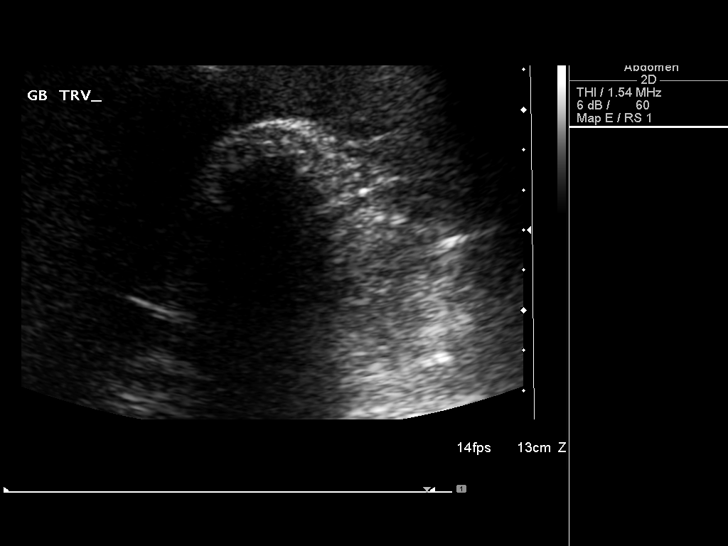
[im 50/86]
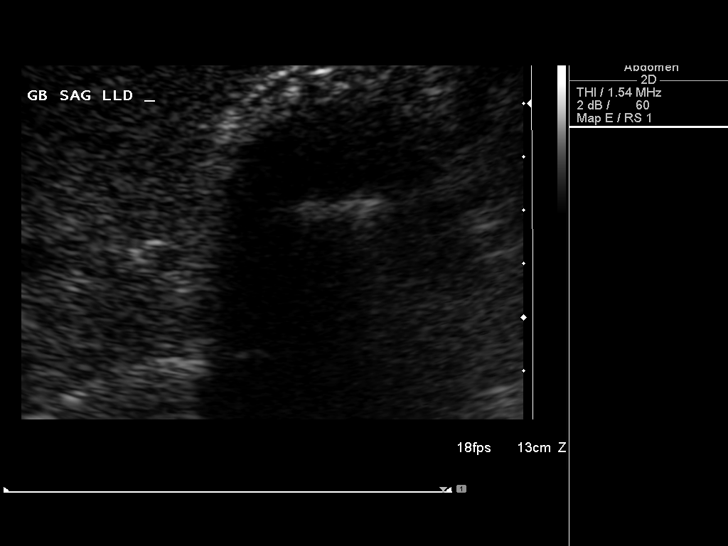
[im 57/86]
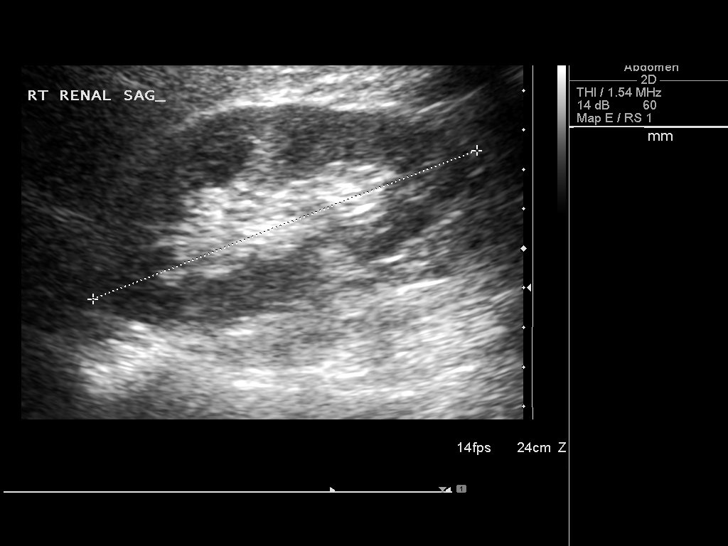
[im 64/86]
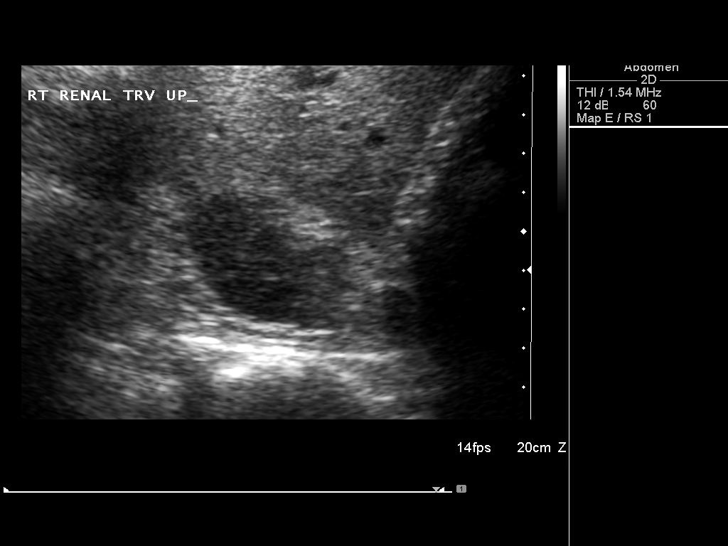
[im 71/86]
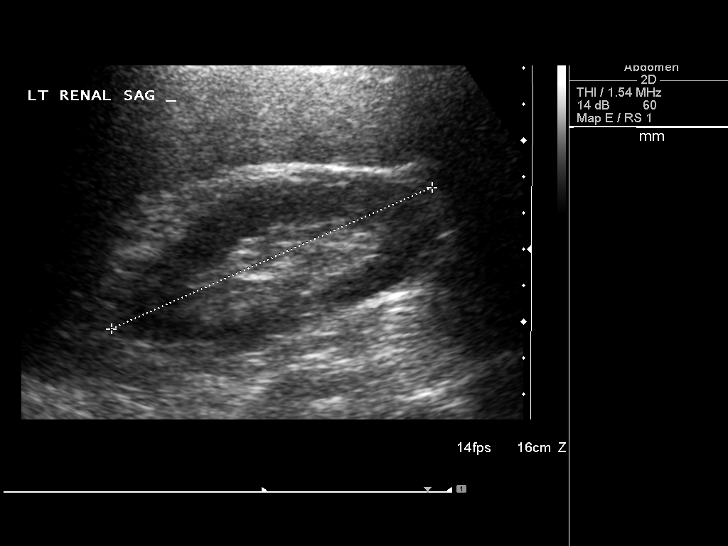
[im 78/86]
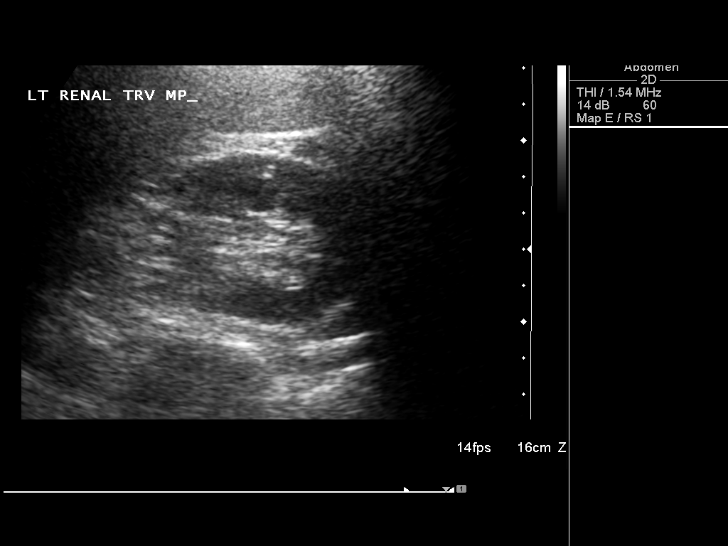
[im 86/86]
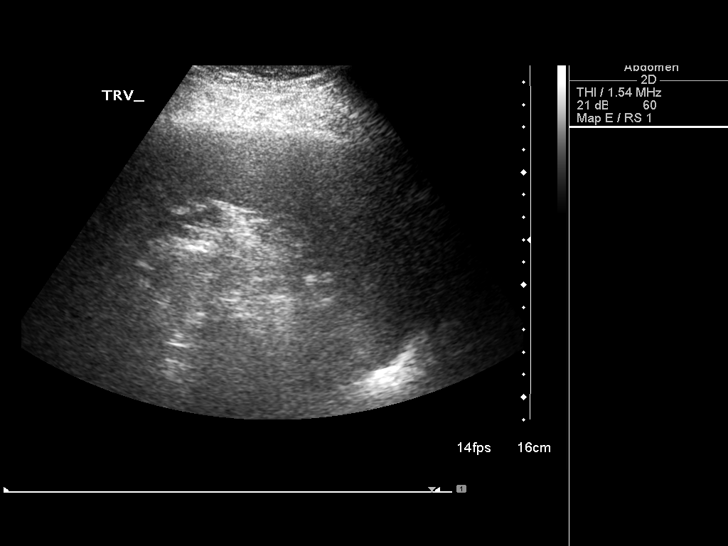

[13 of 25 positions shown; findings below may reference images not displayed]

FINDINGS: Gallbladder: The gallbladder is filled with echogenic mobile
shadowing stones. There is no gallbladder wall thickening,
pericholecystic fluid, or positive sonographic Murphy's sign.

Common bile duct: Diameter: 7 mm

Liver: The hepatic echotexture is mildly heterogeneous. There is no
focal mass or ductal dilation. The surface contour of the liver is
normal.

IVC: No abnormality visualized.

Pancreas: Visualized portion unremarkable.

Spleen: Size and appearance within normal limits.

Right Kidney: Length: 10.7 cm. Echogenicity within normal limits. No
mass or hydronephrosis visualized.

Left Kidney: Length: 10.4 cm. Echogenicity within normal limits. No
mass or hydronephrosis visualized.

Abdominal aorta: No aneurysm visualized.

Other findings: No ascites is observed.
IMPRESSION: 1. The gallbladder is filled with stones. There is no sonographic
evidence of acute cholecystitis.
2. Heterogeneous hepatic echotexture may reflect early fatty
infiltrative change. No discrete hepatic mass is observed.
3. No abnormality is observed elsewhere within the abdomen.

## 2017-04-05 DIAGNOSIS — R87612 Low grade squamous intraepithelial lesion on cytologic smear of cervix (LGSIL): Secondary | ICD-10-CM | POA: Diagnosis not present

## 2017-04-05 DIAGNOSIS — R87610 Atypical squamous cells of undetermined significance on cytologic smear of cervix (ASC-US): Secondary | ICD-10-CM | POA: Diagnosis not present

## 2017-10-10 DIAGNOSIS — Z6832 Body mass index (BMI) 32.0-32.9, adult: Secondary | ICD-10-CM | POA: Diagnosis not present

## 2017-10-10 DIAGNOSIS — Z01419 Encounter for gynecological examination (general) (routine) without abnormal findings: Secondary | ICD-10-CM | POA: Diagnosis not present

## 2017-10-18 DIAGNOSIS — Z0100 Encounter for examination of eyes and vision without abnormal findings: Secondary | ICD-10-CM | POA: Diagnosis not present

## 2017-10-19 DIAGNOSIS — N870 Mild cervical dysplasia: Secondary | ICD-10-CM | POA: Diagnosis not present

## 2017-10-19 DIAGNOSIS — R87610 Atypical squamous cells of undetermined significance on cytologic smear of cervix (ASC-US): Secondary | ICD-10-CM | POA: Diagnosis not present

## 2018-03-20 DIAGNOSIS — R87612 Low grade squamous intraepithelial lesion on cytologic smear of cervix (LGSIL): Secondary | ICD-10-CM | POA: Diagnosis not present

## 2018-03-20 DIAGNOSIS — N870 Mild cervical dysplasia: Secondary | ICD-10-CM | POA: Diagnosis not present

## 2019-04-23 ENCOUNTER — Other Ambulatory Visit: Payer: Self-pay | Admitting: Obstetrics and Gynecology

## 2019-04-23 DIAGNOSIS — R928 Other abnormal and inconclusive findings on diagnostic imaging of breast: Secondary | ICD-10-CM

## 2019-06-12 ENCOUNTER — Ambulatory Visit
Admission: RE | Admit: 2019-06-12 | Discharge: 2019-06-12 | Disposition: A | Discharge status: Home / Self Care | Payer: 59 | Source: Ambulatory Visit | Attending: Obstetrics and Gynecology | Admitting: Obstetrics and Gynecology

## 2019-06-12 ENCOUNTER — Other Ambulatory Visit: Payer: Self-pay

## 2019-06-12 DIAGNOSIS — R928 Other abnormal and inconclusive findings on diagnostic imaging of breast: Secondary | ICD-10-CM

## 2020-11-29 IMAGING — MG MM DIGITAL DIAGNOSTIC UNILAT*L* W/ TOMO
4 series · 4 of 12 positions shown · non-contrast
Comparison: Previous exam(s).

CLINICAL DATA: Screening recall for a possible left breast mass.

EXAM:
DIGITAL DIAGNOSTIC LEFT MAMMOGRAM WITH CAD AND TOMO
ULTRASOUND LEFT BREAST

[L MLO synth-2D]
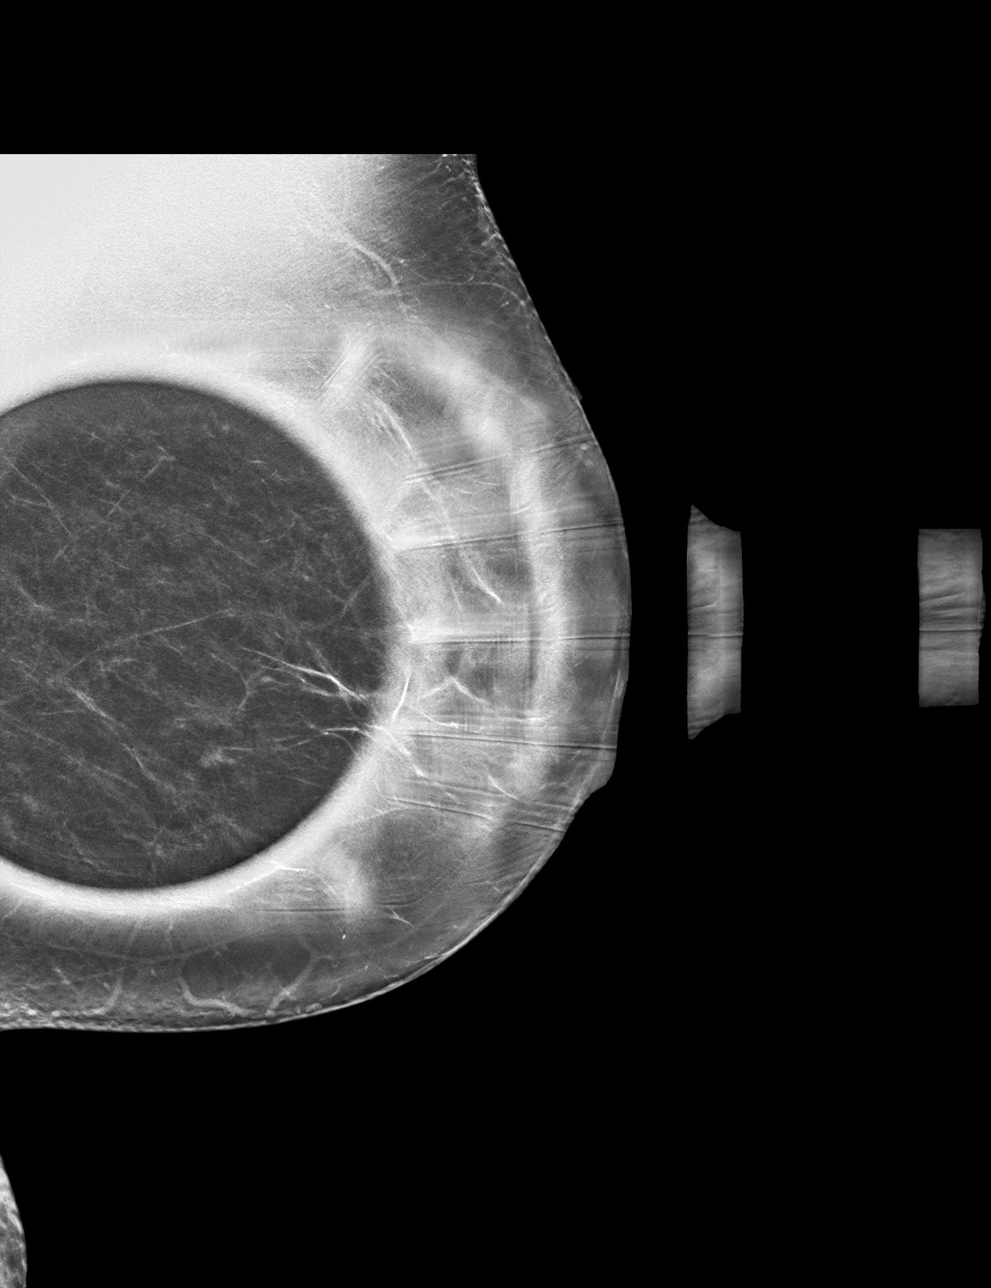

[L CC synth-2D]
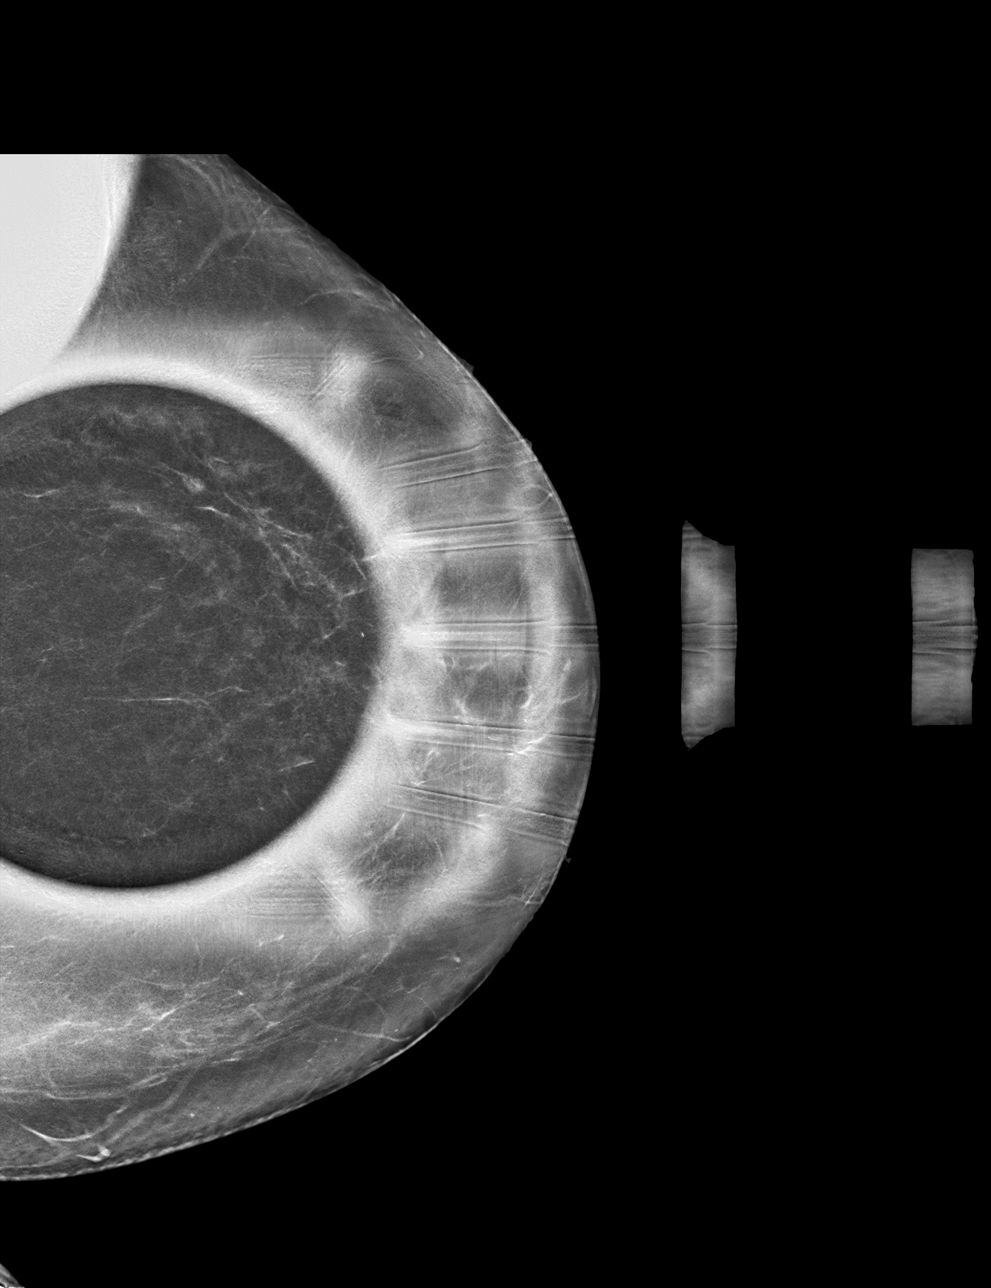

[L MLO tomo · tomo slice 31/62.0]
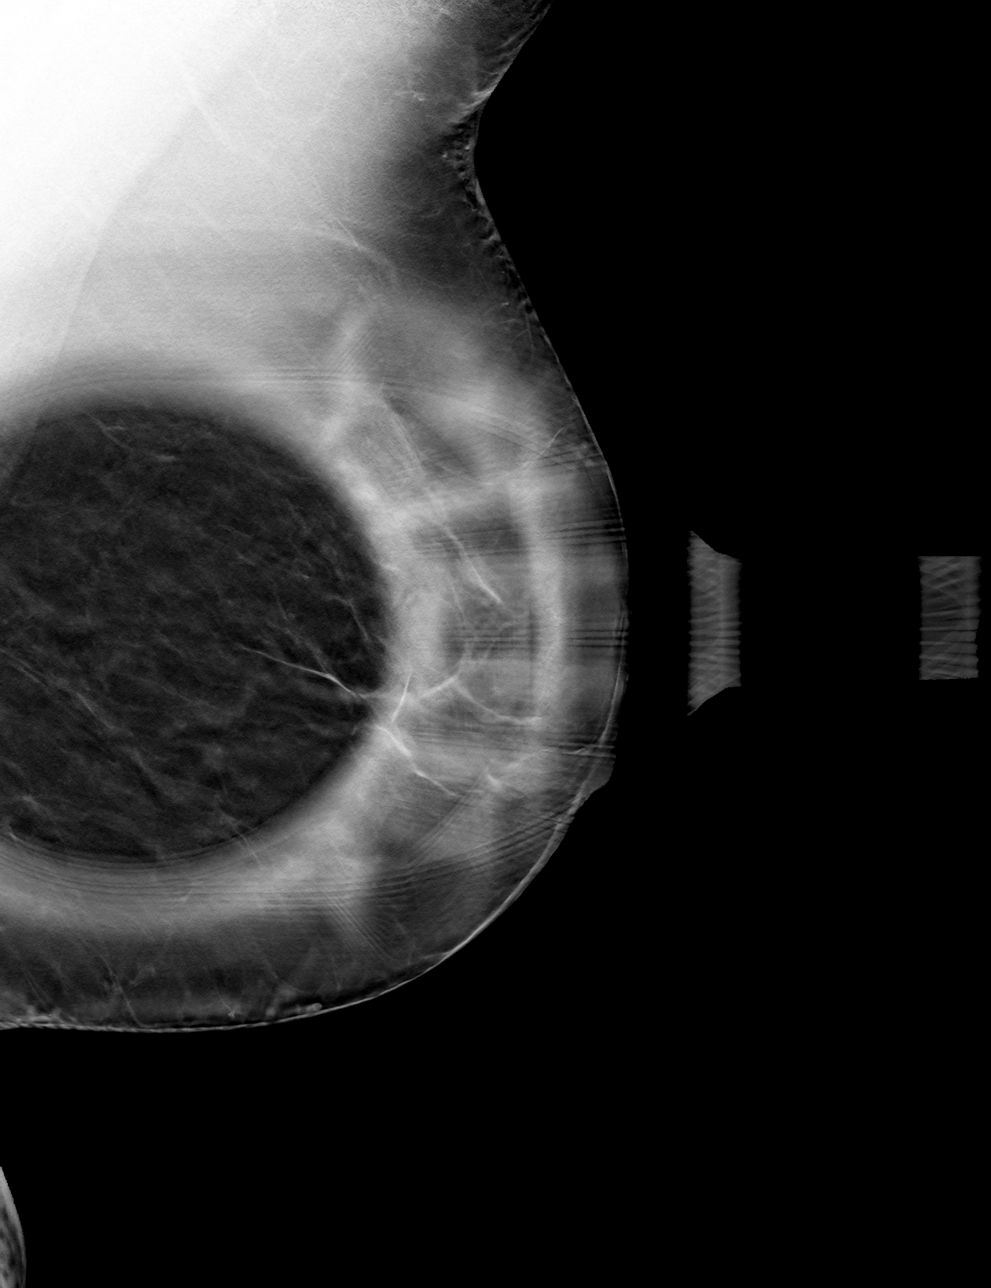

[L CC tomo · tomo slice 30/59.0]
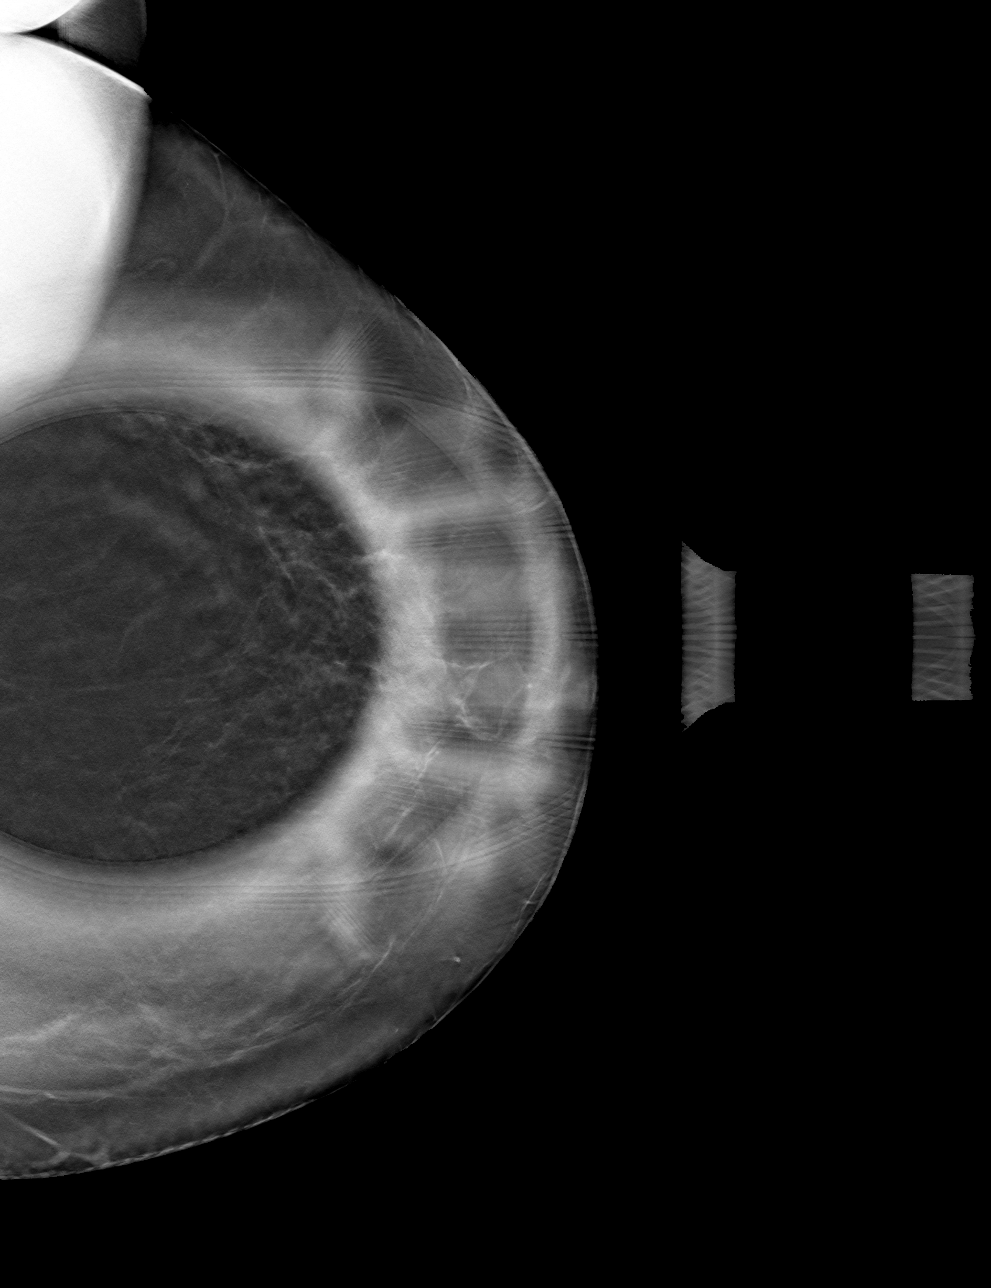

[4 of 12 positions shown; findings below may reference images not displayed]

ACR Breast Density Category b: There are scattered areas of
fibroglandular density.
FINDINGS: The possible mass seen on the screening study persists diagnostic
imaging. It lies laterally the 3-4 o'clock position. It measures 3-4
mm in long axis.

There are no other masses, no areas of architectural distortion and
no suspicious calcifications.

Mammographic images were processed with CAD.

Targeted ultrasound is performed, showing a small cyst in the 4
o'clock position of the left breast, 5 cm the nipple, measuring
x 1.2 x 2 mm, consistent in size, shape and location to the
mammographic mass. There are no solid masses or suspicious lesions.
IMPRESSION: 1. No evidence of breast malignancy.
2. Small benign left breast cyst.

RECOMMENDATION:
Screening mammogram in one year.(Code:VT-O-1HJ)

I have discussed the findings and recommendations with the patient.
If applicable, a reminder letter will be sent to the patient
regarding the next appointment.

BI-RADS CATEGORY  2: Benign.

## 2020-11-29 IMAGING — US US BREAST*L* LIMITED INC AXILLA
1 series · 6 of 6 positions shown · non-contrast
Comparison: Previous exam(s).

CLINICAL DATA: Screening recall for a possible left breast mass.

EXAM:
DIGITAL DIAGNOSTIC LEFT MAMMOGRAM WITH CAD AND TOMO
ULTRASOUND LEFT BREAST

[Series 1: us breast*left* limited inc axilla · 0.06mm/px · 6 of 6 slices shown]
[im 1/6]
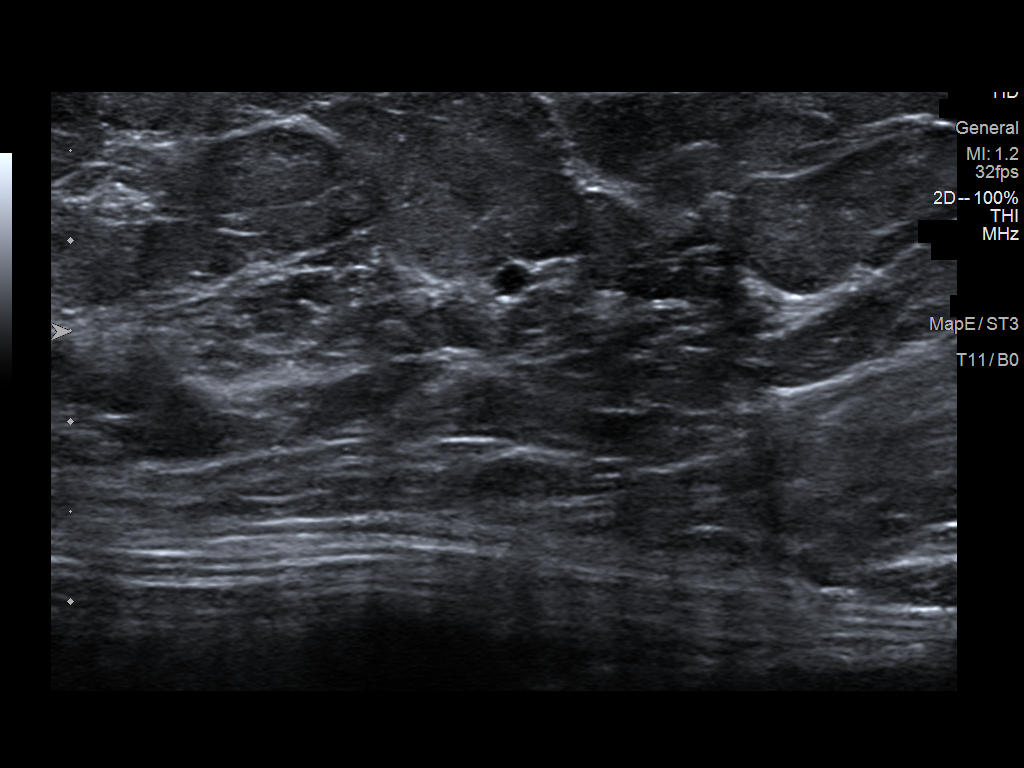
[im 2/6]
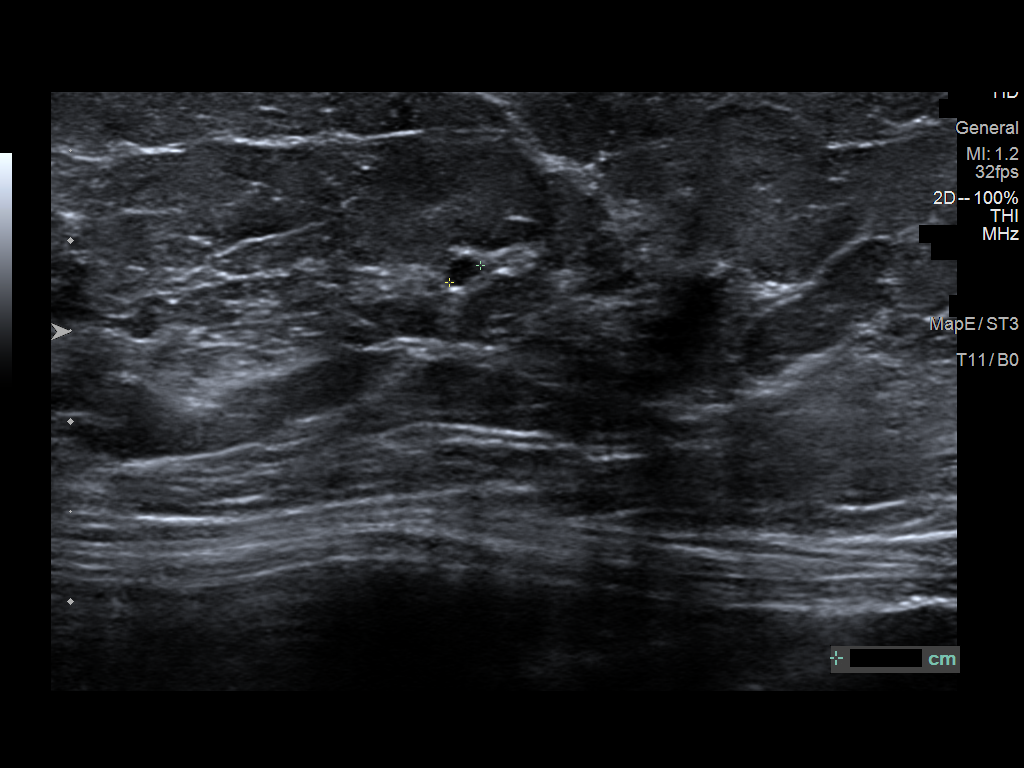
[im 3/6]
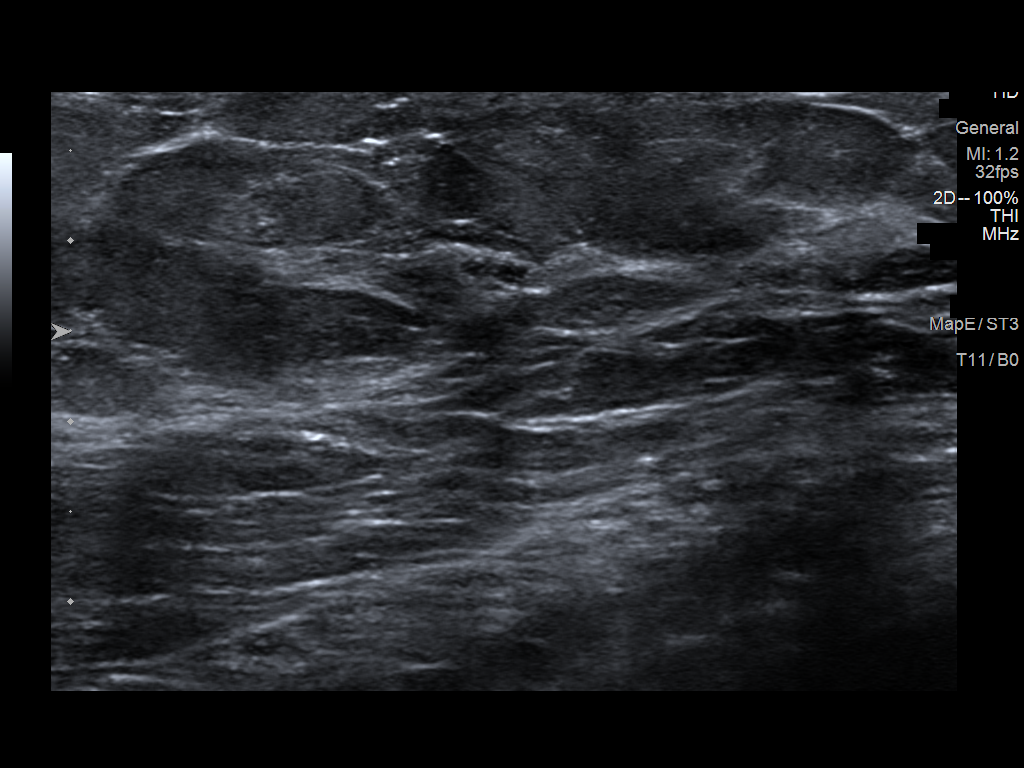
[im 4/6]
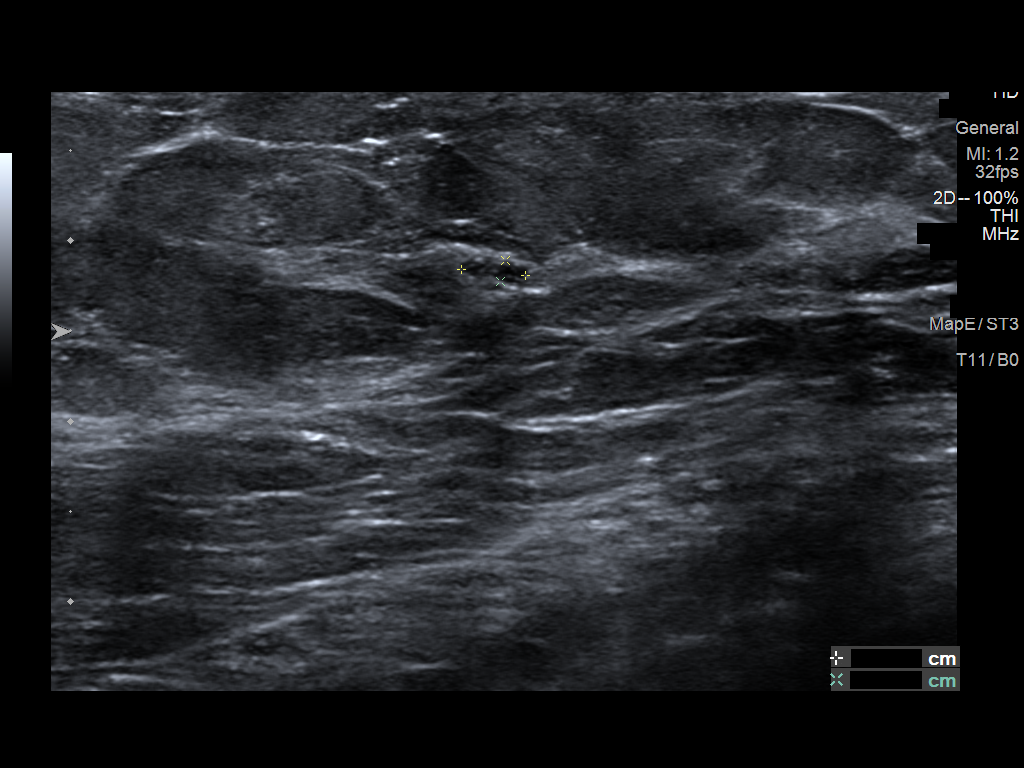
[im 5/6]
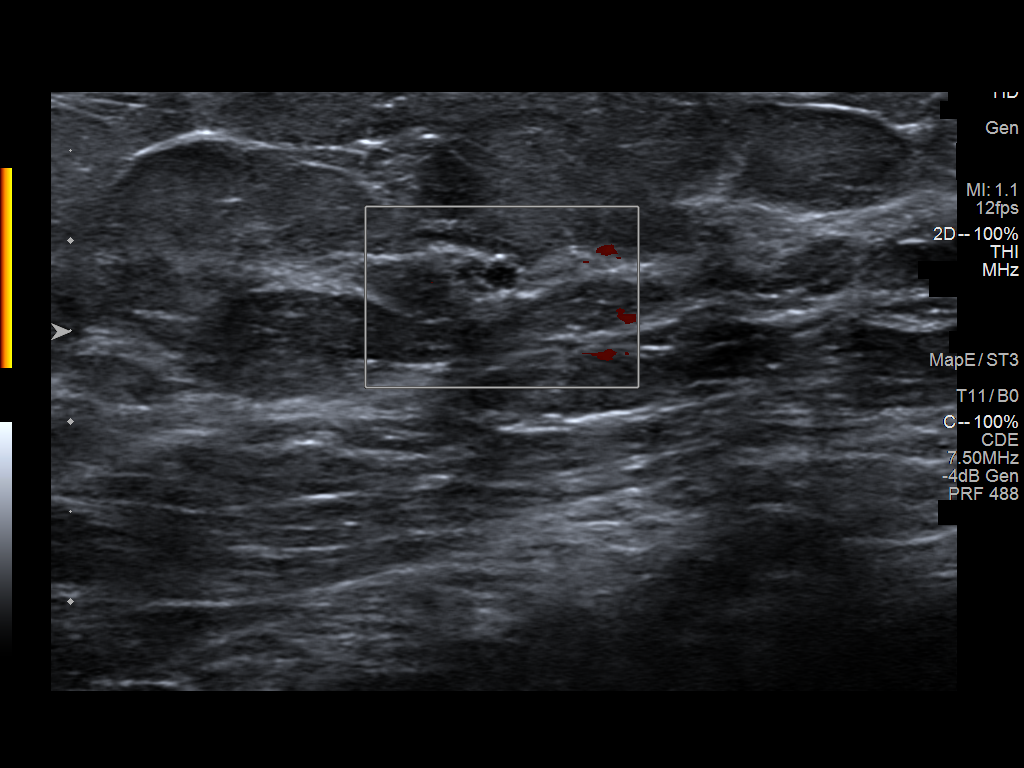
[im 6/6]
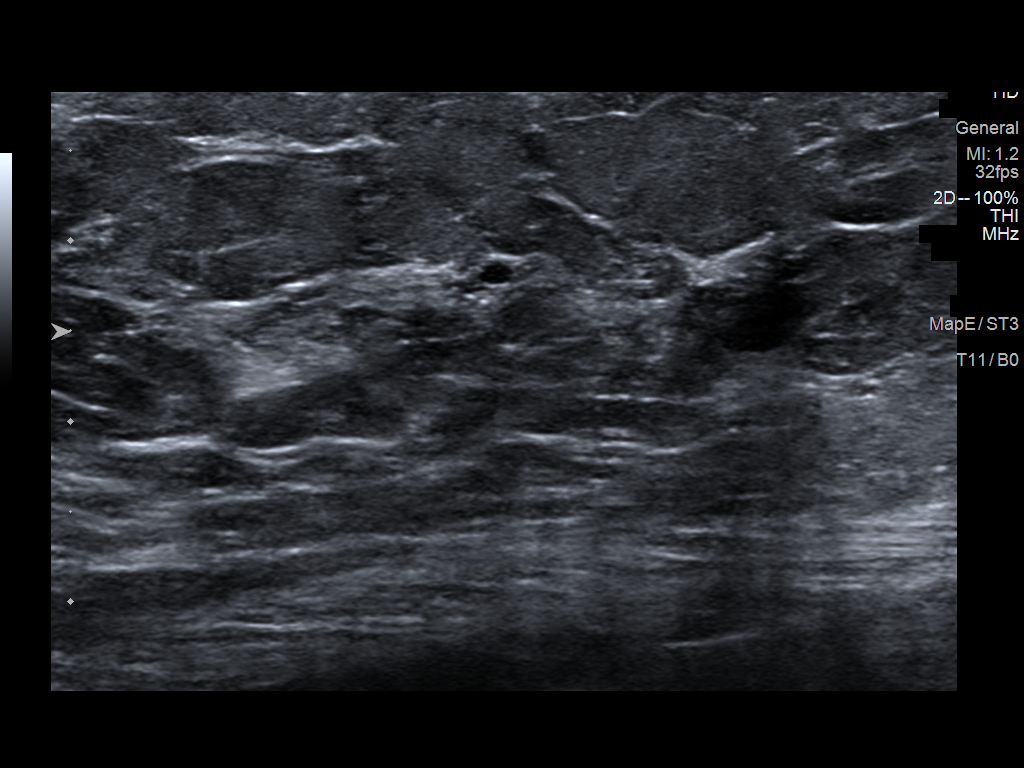

[6 of 6 positions shown; findings below may reference images not displayed]

ACR Breast Density Category b: There are scattered areas of
fibroglandular density.
FINDINGS: The possible mass seen on the screening study persists diagnostic
imaging. It lies laterally the 3-4 o'clock position. It measures 3-4
mm in long axis.

There are no other masses, no areas of architectural distortion and
no suspicious calcifications.

Mammographic images were processed with CAD.

Targeted ultrasound is performed, showing a small cyst in the 4
o'clock position of the left breast, 5 cm the nipple, measuring
x 1.2 x 2 mm, consistent in size, shape and location to the
mammographic mass. There are no solid masses or suspicious lesions.
IMPRESSION: 1. No evidence of breast malignancy.
2. Small benign left breast cyst.

RECOMMENDATION:
Screening mammogram in one year.(Code:VT-O-1HJ)

I have discussed the findings and recommendations with the patient.
If applicable, a reminder letter will be sent to the patient
regarding the next appointment.

BI-RADS CATEGORY  2: Benign.
